# Patient Record
Sex: Female | Born: 1940 | Race: White | Hispanic: No | Marital: Single | Smoking: Never smoker
Health system: Southern US, Community
[De-identification: ages and names within clinical notes are randomized; demographics above are authoritative.]

## PROBLEM LIST (undated history)

## (undated) DIAGNOSIS — M1990 Unspecified osteoarthritis, unspecified site: Secondary | ICD-10-CM

## (undated) DIAGNOSIS — C801 Malignant (primary) neoplasm, unspecified: Secondary | ICD-10-CM

## (undated) HISTORY — PX: THYROIDECTOMY: SHX17

---

## 2015-03-17 ENCOUNTER — Emergency Department
Admission: EM | Admit: 2015-03-17 | Discharge: 2015-03-17 | Disposition: A | Discharge status: Home / Self Care | Payer: Medicare Other | Attending: Emergency Medicine | Admitting: Emergency Medicine

## 2015-03-17 ENCOUNTER — Emergency Department: Payer: Medicare Other

## 2015-03-17 ENCOUNTER — Encounter: Payer: Self-pay | Admitting: Emergency Medicine

## 2015-03-17 DIAGNOSIS — S300XXA Contusion of lower back and pelvis, initial encounter: Secondary | ICD-10-CM | POA: Diagnosis not present

## 2015-03-17 DIAGNOSIS — Y9289 Other specified places as the place of occurrence of the external cause: Secondary | ICD-10-CM | POA: Insufficient documentation

## 2015-03-17 DIAGNOSIS — Z79899 Other long term (current) drug therapy: Secondary | ICD-10-CM | POA: Diagnosis not present

## 2015-03-17 DIAGNOSIS — Y998 Other external cause status: Secondary | ICD-10-CM | POA: Insufficient documentation

## 2015-03-17 DIAGNOSIS — S0093XA Contusion of unspecified part of head, initial encounter: Secondary | ICD-10-CM

## 2015-03-17 DIAGNOSIS — Y9389 Activity, other specified: Secondary | ICD-10-CM | POA: Insufficient documentation

## 2015-03-17 DIAGNOSIS — W01198A Fall on same level from slipping, tripping and stumbling with subsequent striking against other object, initial encounter: Secondary | ICD-10-CM | POA: Insufficient documentation

## 2015-03-17 DIAGNOSIS — S0083XA Contusion of other part of head, initial encounter: Secondary | ICD-10-CM | POA: Diagnosis not present

## 2015-03-17 DIAGNOSIS — E039 Hypothyroidism, unspecified: Secondary | ICD-10-CM | POA: Insufficient documentation

## 2015-03-17 DIAGNOSIS — S0990XA Unspecified injury of head, initial encounter: Secondary | ICD-10-CM | POA: Diagnosis present

## 2015-03-17 HISTORY — DX: Unspecified osteoarthritis, unspecified site: M19.90

## 2015-03-17 HISTORY — DX: Malignant (primary) neoplasm, unspecified: C80.1

## 2015-03-17 MED ORDER — TRAMADOL HCL 50 MG PO TABS: 50.0000 mg | ORAL_TABLET | Freq: Two times a day (BID) | ORAL | Status: AC | PRN

## 2015-03-17 MED ORDER — TRAMADOL HCL 50 MG PO TABS
50.0000 mg | ORAL_TABLET | Freq: Once | ORAL | Status: AC
  Administered 2015-03-17: 50 mg via ORAL
  Filled 2015-03-17: qty 1

## 2015-03-17 NOTE — ED Provider Notes (Signed)
[facility] Emergency Department Provider Note  ____________________________________________  Time seen: Approximately 5:08 PM  I have reviewed the triage vital signs and the nursing notes.   HISTORY  Chief Complaint Fall; Back Pain; and Headache    HPI Shirley Mejia is a 75 y.o. female patient complaining of headache and low back pain secondary to a fall. Patient states she tripped and fell backwards 3 days ago. Patient states she landed on her buttocks and then struck the back of her head on the ground. Patient states she had a hematoma back of her head which is almost resolved. . Patient states she is having increasing headache and vertigo but denies any vision disturbance from this fall. Patient states he has increased back pain especially going up stairs. He denies any radicular component to her back pain. Patient denies any bladder or bowel dysfunction. Patient rates her pain discomfort as a 6/10. No palliative measures taken for this complaint.  Past Medical History  Diagnosis Date  . Arthritis     osteoperosis  . 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   Past Surgical History  Procedure Laterality Date  . Thyroidectomy      Current Outpatient Rx  Name  Route  Sig  Dispense  Refill  . levothyroxine (SYNTHROID, LEVOTHROID) 100 MCG tablet   Oral   Take 100 mcg by mouth daily before breakfast.         . traMADol (ULTRAM) 50 MG tablet   Oral   Take 1 tablet (50 mg total) by mouth every 12 (twelve) hours as needed for moderate pain.   12 tablet   0     Allergies Review of patient's allergies indicates no known allergies.  No family history on file.  Social History Social History  Substance Use Topics  . Smoking status: Never Smoker   . Smokeless tobacco: Never Used  . Alcohol Use: No    Review of Systems Constitutional: No fever/chills Eyes: No visual changes. ENT: No sore  throat. Cardiovascular: Denies chest pain. Respiratory: Denies shortness of breath. Gastrointestinal: No abdominal pain.  No nausea, no vomiting.  No diarrhea.  No constipation. Genitourinary: Negative for dysuria. Musculoskeletal: Positive for back pain. Skin: Negative for rash. Neurological: Positive for headaches, but denies focal weakness or numbness. Endocrine:Hypothyroidism   ____________________________________________   PHYSICAL EXAM:  VITAL SIGNS: ED Triage Vitals  Enc Vitals Group     BP 03/17/15 1645 189/78 mmHg     Pulse Rate 03/17/15 1645 63     Resp 03/17/15 1645 18     Temp 03/17/15 1645 98 F (36.7 C)     Temp Source 03/17/15 1645 Oral     SpO2 03/17/15 1645 98 %     Weight 03/17/15 1645 119 lb (53.978 kg)     Height 03/17/15 1645 5\' 3"  (1.6 m)     Head Cir --      Peak Flow --      Pain Score 03/17/15 1646 6     Pain Loc --      Pain Edu? --      Excl. in [place]? --     Constitutional: Alert and oriented. Well appearing and in no acute distress. Eyes: Conjunctivae are normal. PERRL. EOMI. Head: Atraumatic. No obvious swelling Nose: No congestion/rhinnorhea. Mouth/Throat: Mucous membranes are moist.  Oropharynx non-erythematous. Neck: No stridor.  No cervical spine tenderness to palpation. Hematological/Lymphatic/Immunilogical:  No cervical lymphadenopathy. Cardiovascular: Normal rate, regular rhythm. Grossly normal heart sounds.  Good peripheral circulation. Respiratory: Normal respiratory effort.  No retractions. Lungs CTAB. Gastrointestinal: Soft and nontender. No distention. No abdominal bruits. No CVA tenderness. Musculoskeletal: No obvious deformity. Patient has decreased range of motion with flexion. Patient has moderate guarding palpation L4-S1. Neurologic:  Normal speech and language. No gross focal neurologic deficits are appreciated. No gait instability. Skin:  Skin is warm, dry and intact. No rash noted. Ecchymosis coccyx area Psychiatric: Mood  and affect are normal. Speech and behavior are normal.  ____________________________________________   LABS (all labs ordered are listed, but only abnormal results are displayed)  Labs Reviewed - No data to display ____________________________________________  EKG   ____________________________________________  RADIOLOGY  No acute findings on CT of head. X-ray of back reveals only degenerative changes. I, Sable Feil, personally viewed and evaluated these images (plain radiographs) as part of my medical decision making, as well as reviewing the written report by the radiologist.  ____________________________________________   PROCEDURES  Procedure(s) performed: None  Critical Care performed: No  ____________________________________________   INITIAL IMPRESSION / ASSESSMENT AND PLAN / ED COURSE  Pertinent labs & imaging results that were available during my care of the patient were reviewed by me and considered in my medical decision making (see chart for details).  Head and back contusion secondary to fall. Discuss CT and x-ray findings with patient. Patient given prescription for tramadol. Patient advised to follow-up family doctor if condition persists. ____________________________________________   FINAL CLINICAL IMPRESSION(S) / ED DIAGNOSES  Final diagnoses:  Head contusion, initial encounter  Coccyx contusion, initial encounter      Sable Feil, PA-C 03/17/15 1815  Nance Pear, MD 03/17/15 [phone]

## 2015-03-17 NOTE — ED Notes (Signed)
AAOx3.  Skin warm and dry.  Ambulates with easy and steady gait.  Moving all extremities equally and strong. Posture upright and relaxed.

## 2015-03-17 NOTE — ED Notes (Signed)
States tripped on Saturday, fell - landing on back.  C/O low back pain and headaches since fall.

## 2016-12-05 IMAGING — CR DG LUMBAR SPINE COMPLETE 4+V
1 series · 5 of 5 positions shown · non-contrast
Comparison: None.

CLINICAL DATA: Patient status post fall. Increasing lower back
pain.

EXAM:
LUMBAR SPINE - COMPLETE 4+ VIEW

[Series 1: t lumbar spine ap · 0.14mm/px · 5 of 5 slices shown]
[im 1/5]
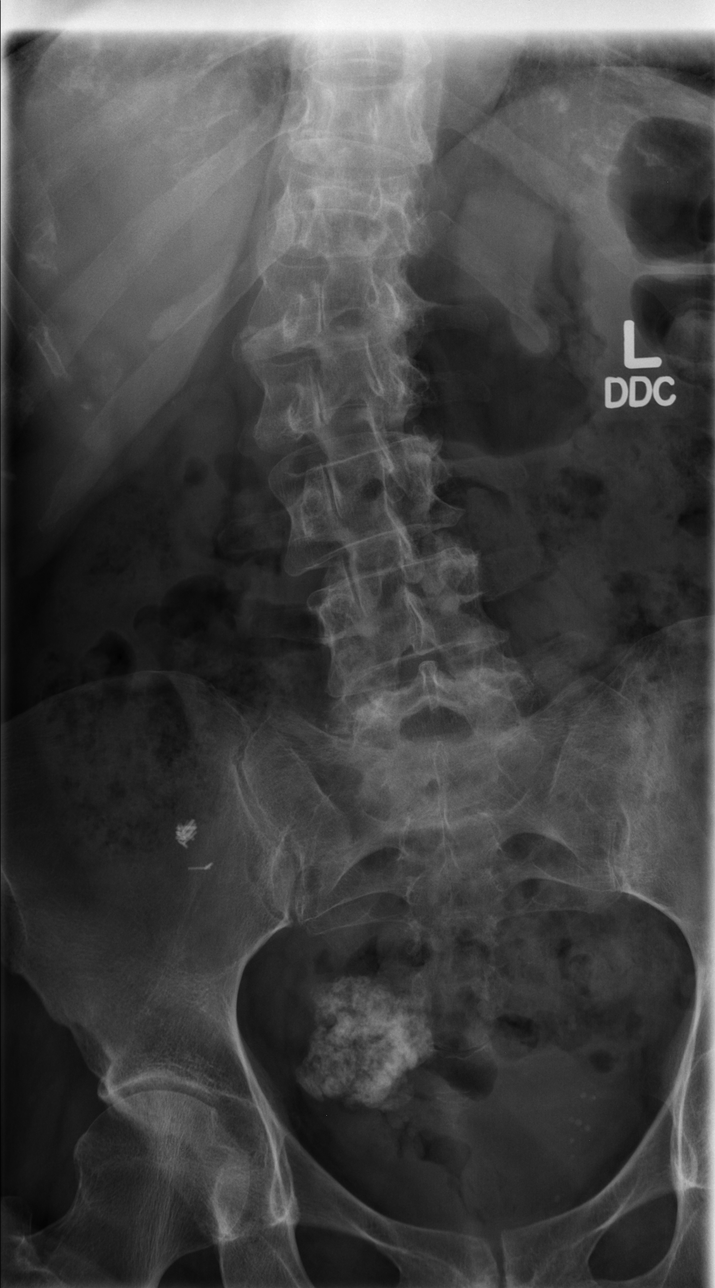
[im 2/5]
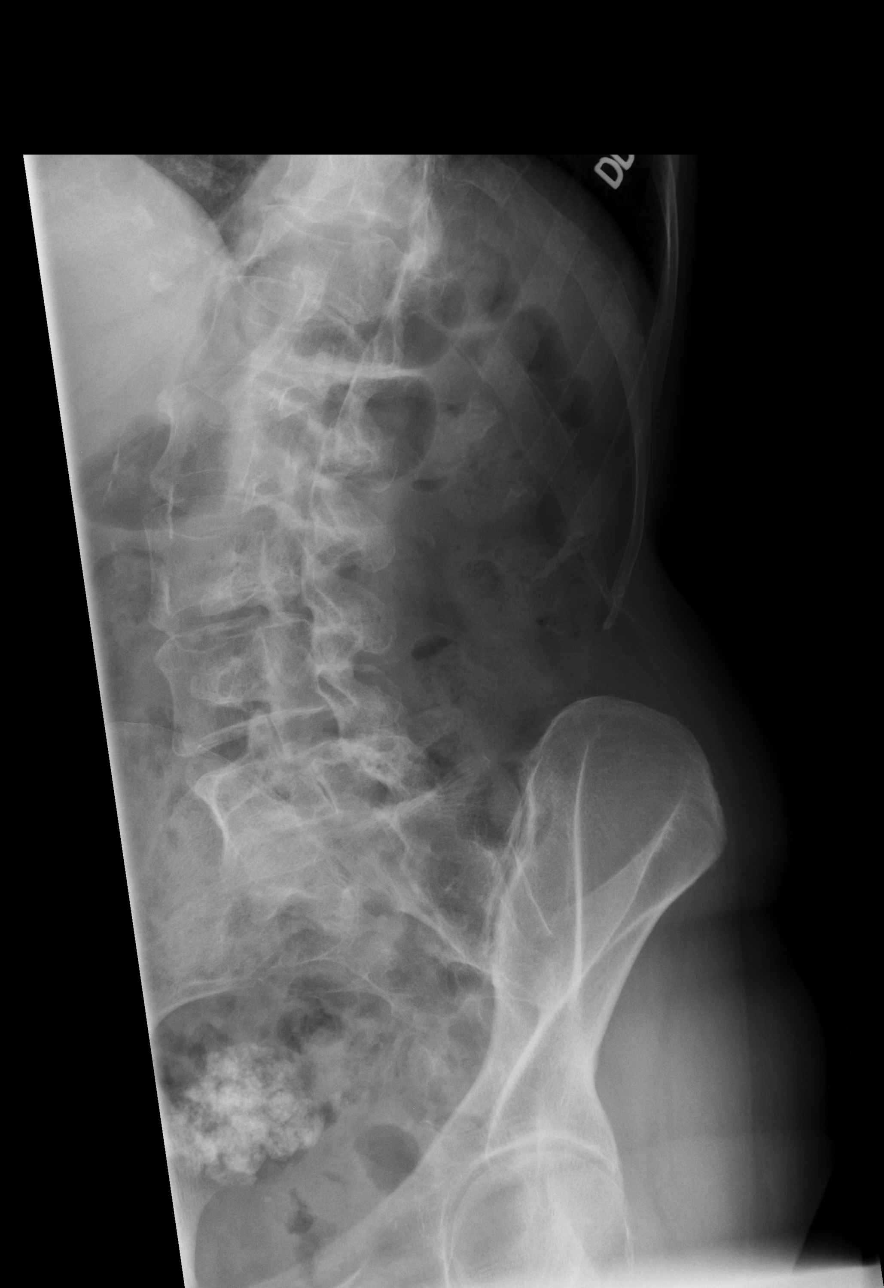
[im 3/5]
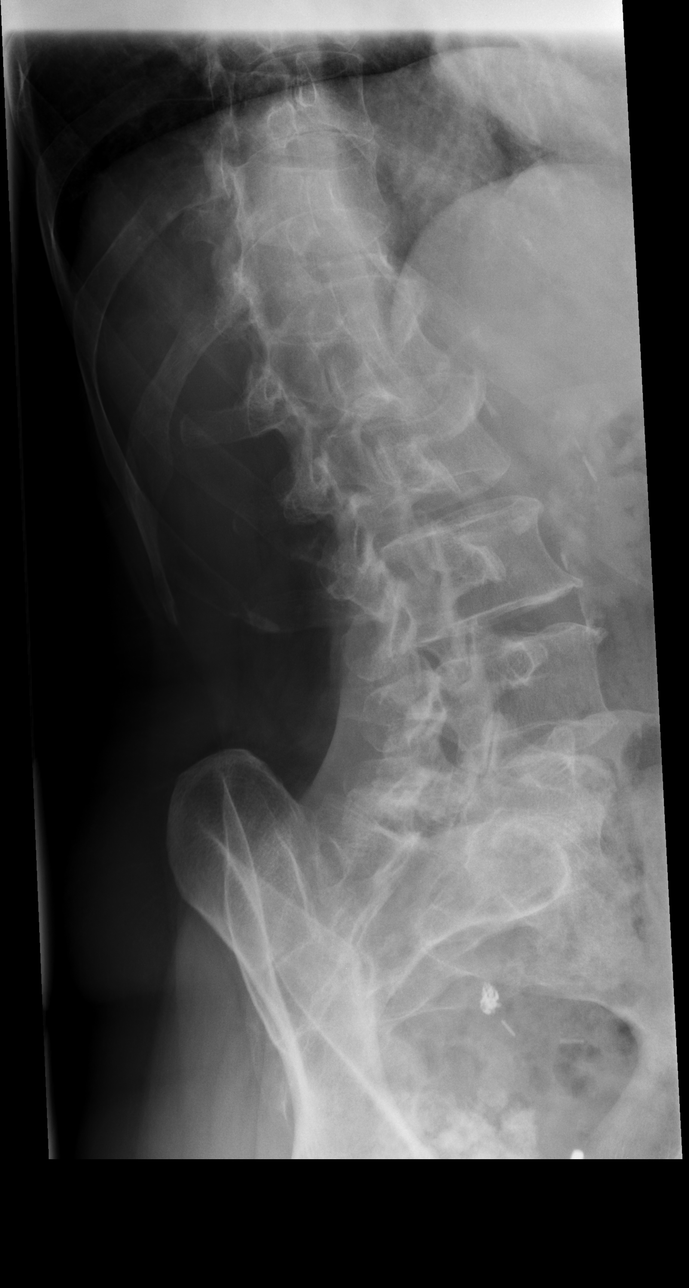
[im 4/5]
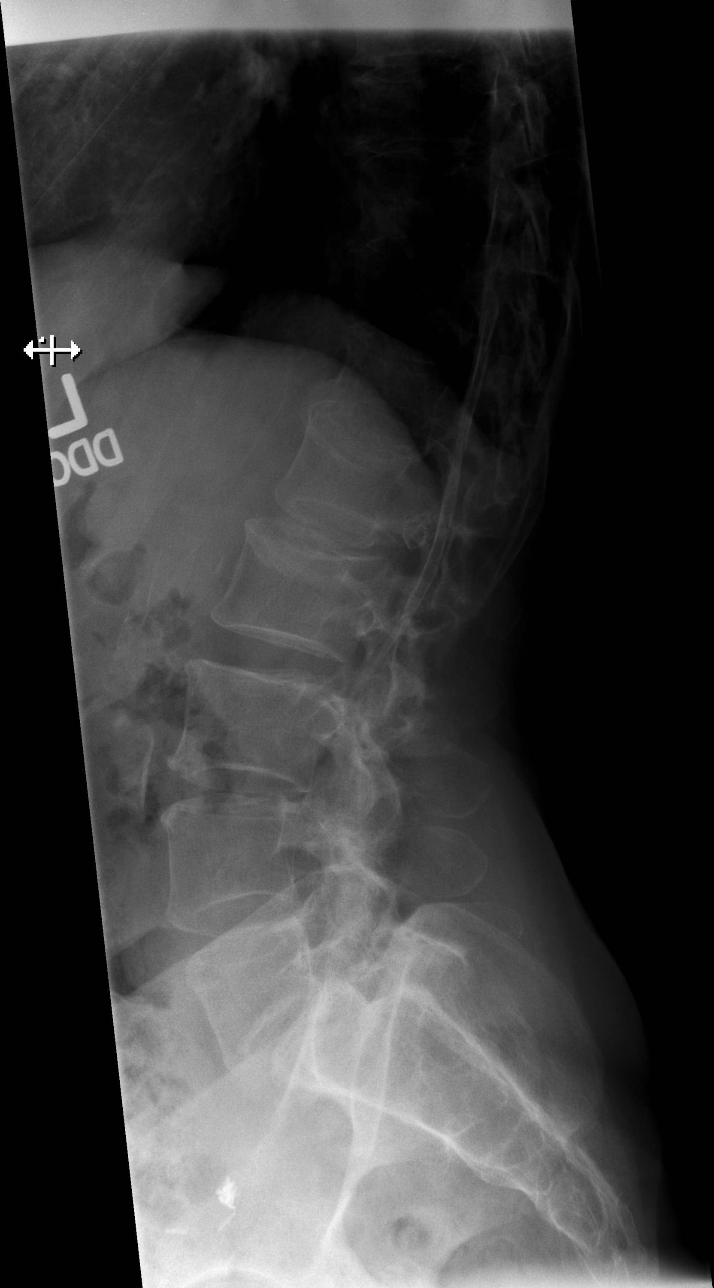
[im 5/5]
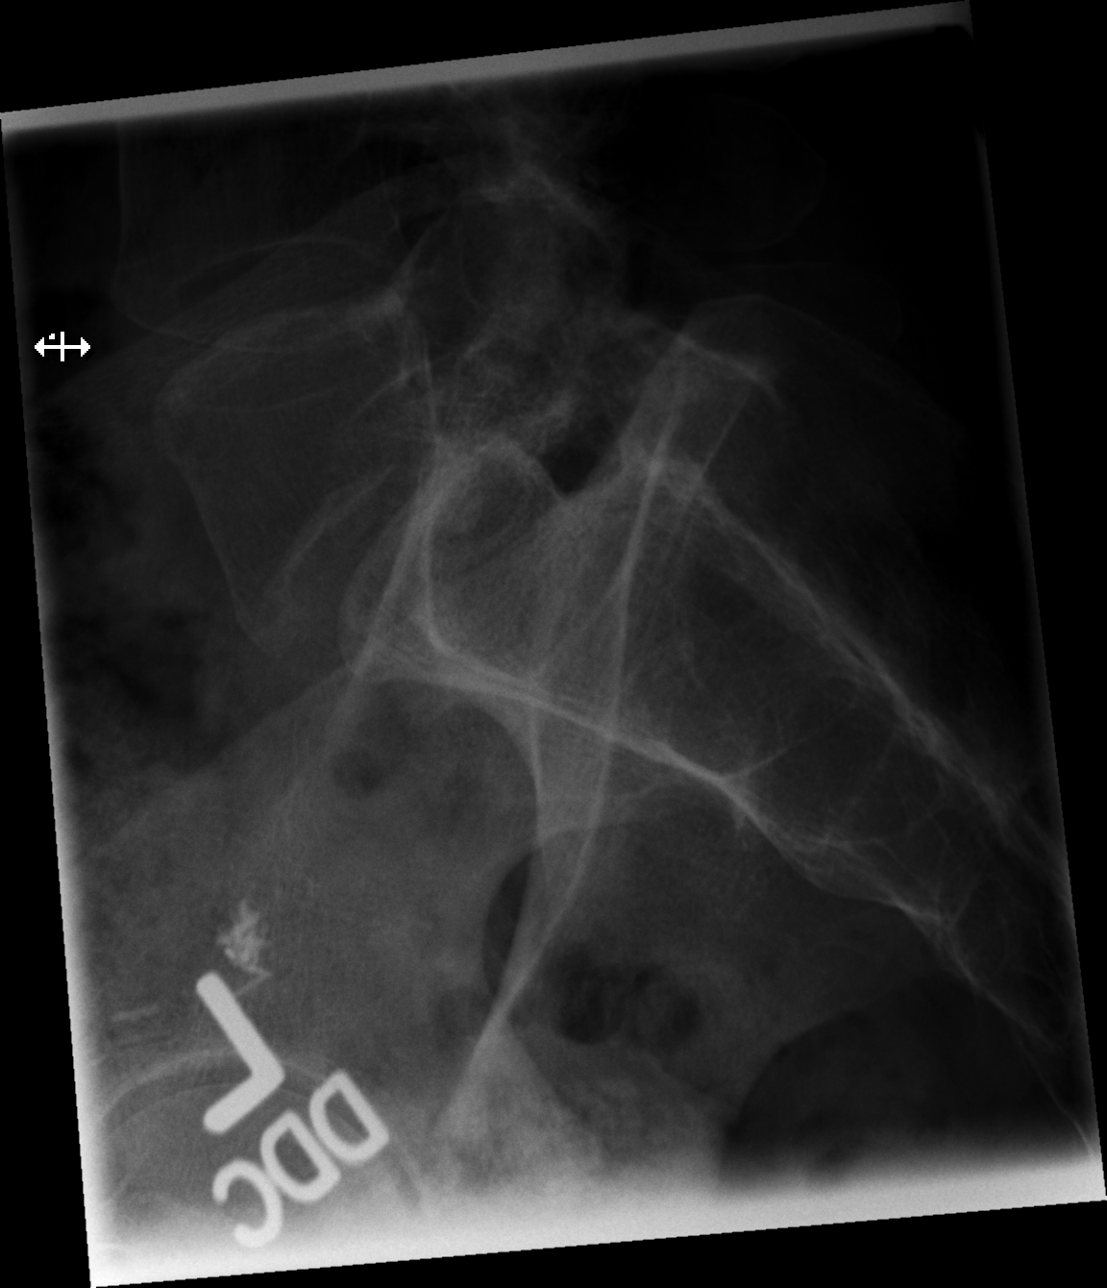

[5 of 5 positions shown; findings below may reference images not displayed]

FINDINGS: There is grade 1 anterolisthesis of L5 on S1. Rightward curvature of
lumbar spine. Relative preservation of the vertebral body heights.
Left SI joint degenerative change. Large calcified mass in the
pelvis. Probable surgical clips within the right hemipelvis. Stool
throughout the visualized colon.
IMPRESSION: Grade 1 anterolisthesis of L5 on S1

Calcified mass within the pelvis is nonspecific however likely
represents a fibroid. Consider dedicated evaluation with pelvic
ultrasound.

## 2016-12-05 IMAGING — CT CT HEAD W/O CM
2 series · 14 of 30 positions shown, 16 images · non-contrast
Comparison: None.

CLINICAL DATA: Patient status post fall. Pain to the back of the
head. Initial encounter. No reported loss of consciousness.

EXAM:
CT HEAD WITHOUT CONTRAST
TECHNIQUE: Contiguous axial images were obtained from the base of the skull
through the vertex without intravenous contrast.

[Series 2: head wo · axial · 0.42mm/px · z∈[+546,+661]mm · 6 of 36 slices shown, 8 images]
[im 6/36  brain]
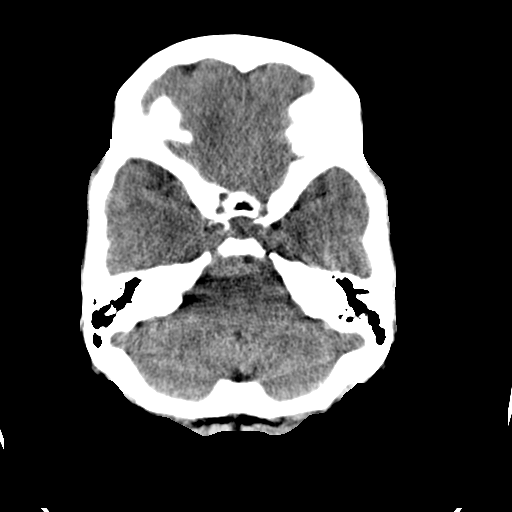
[im 6/36  bone]
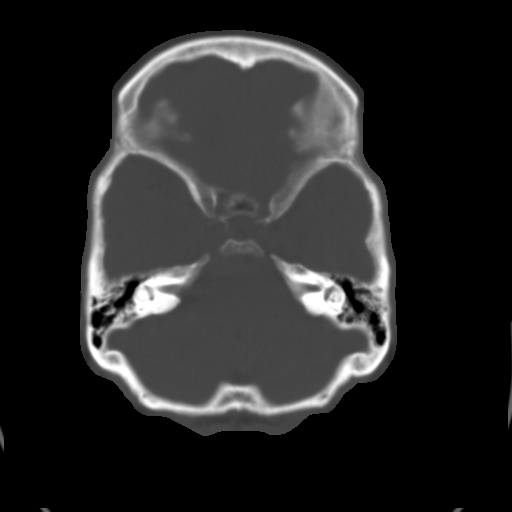
[im 11/36  brain]
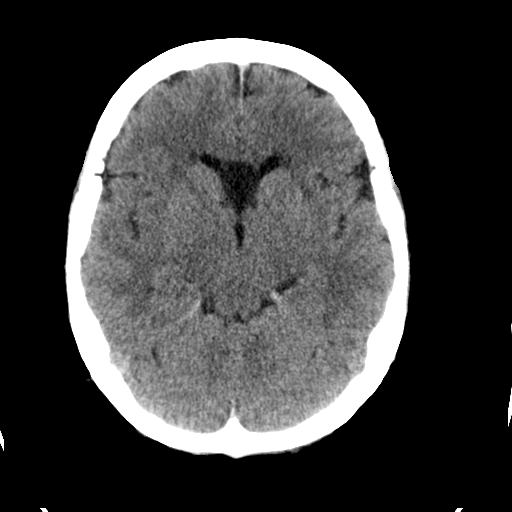
[im 16/36  brain]
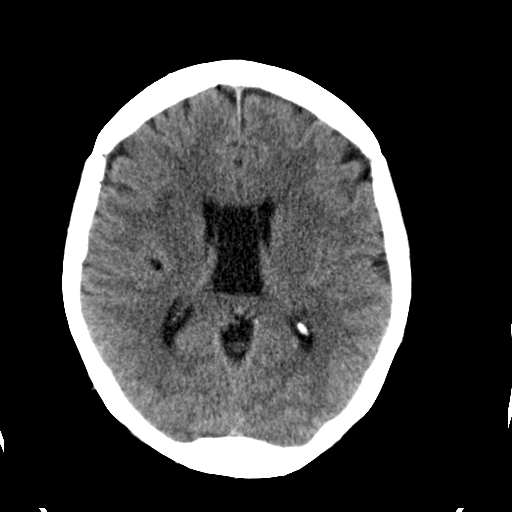
[im 21/36  brain]
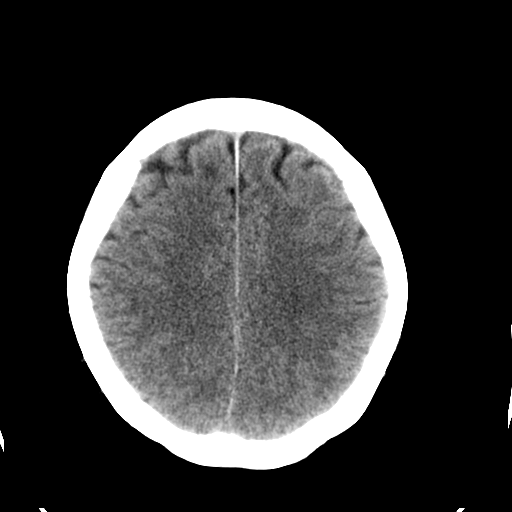
[im 26/36  brain]
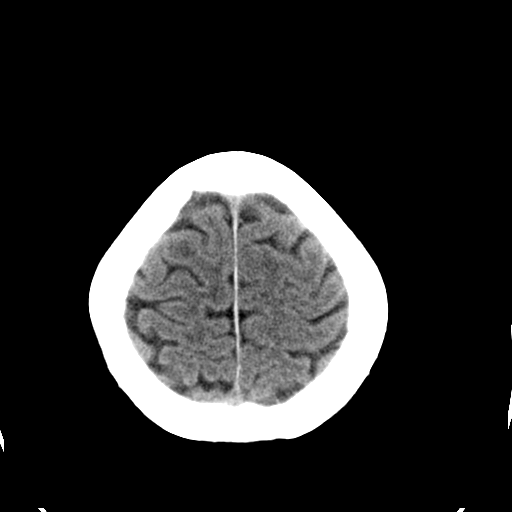
[im 26/36  bone]
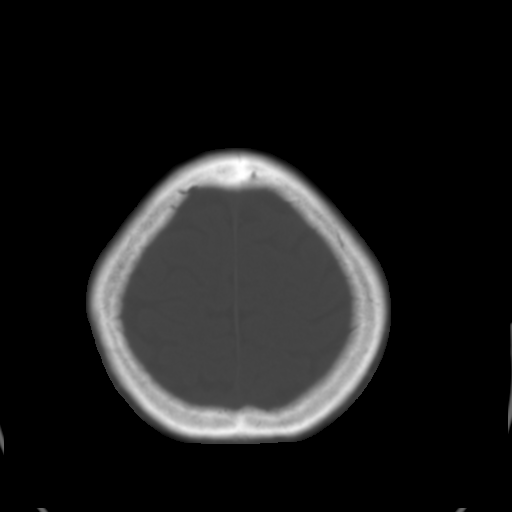
[im 31/36  brain]
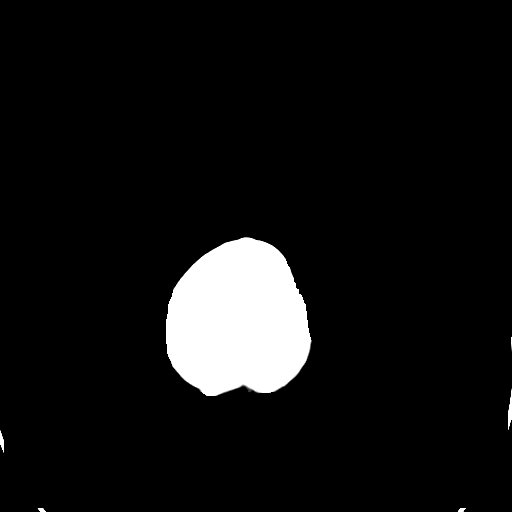

[Series 3: head bone · axial · 0.42mm/px · z∈[+537,+669]mm · 8 of 108 slices shown]
[im 11/108  bone]
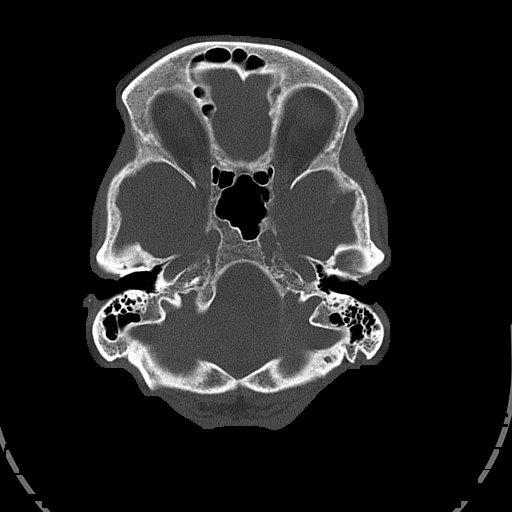
[im 21/108  bone]
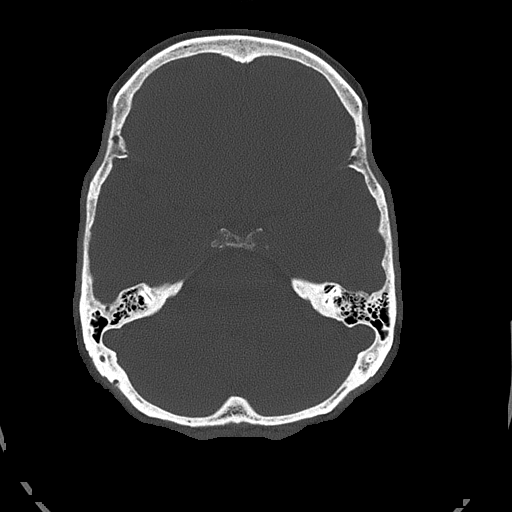
[im 36/108  bone]
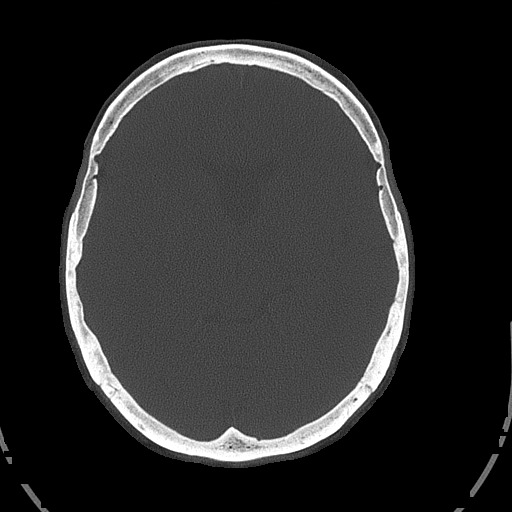
[im 46/108  bone]
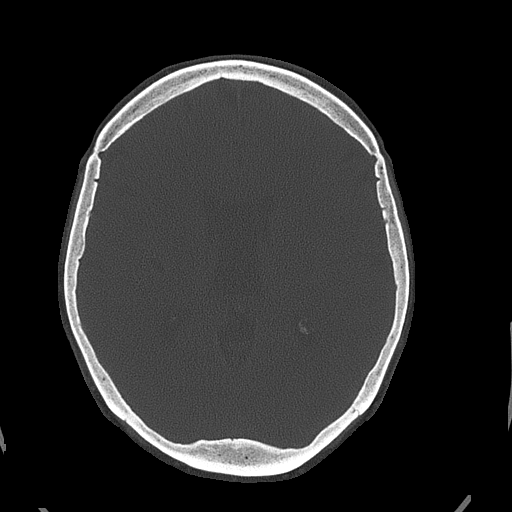
[im 62/108  bone]
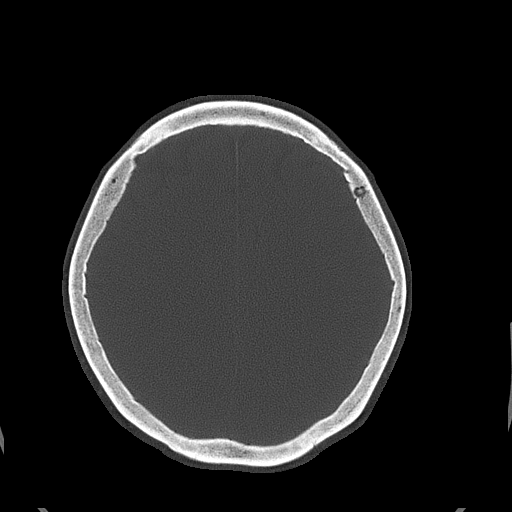
[im 72/108  bone]
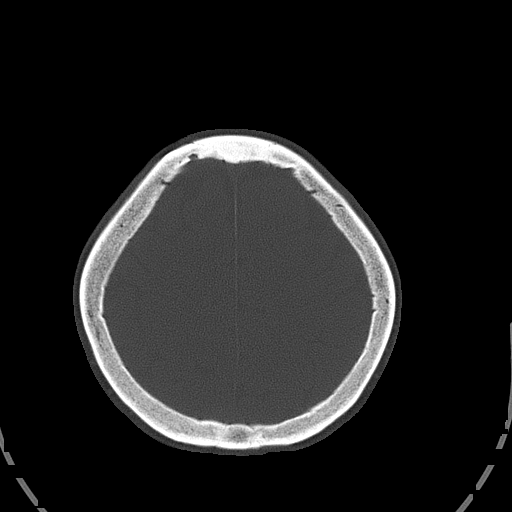
[im 87/108  bone]
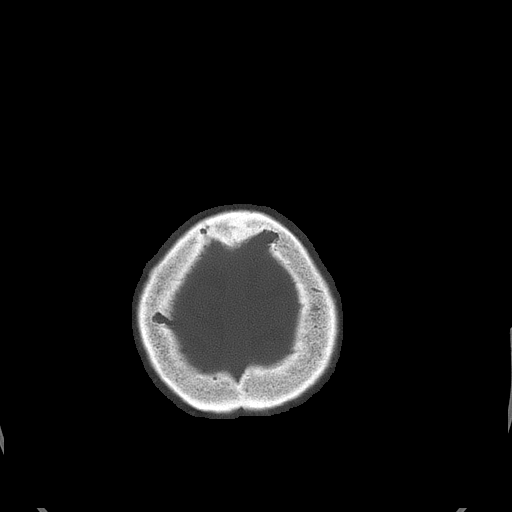
[im 97/108  bone]
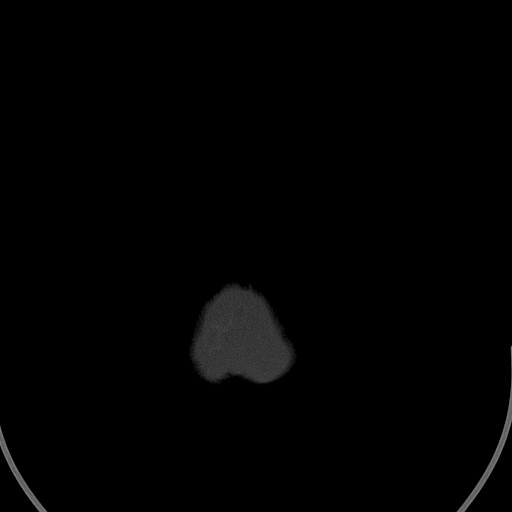

[14 of 30 positions shown; findings below may reference images not displayed]

FINDINGS: Ventricles and sulci are appropriate for patient's age. No evidence
for acute cortically based infarct, intracranial hemorrhage, mass
lesion or mass-effect. Cavum septum pellucidum. Orbits are
unremarkable. Paranasal sinuses are well aerated. Mastoid air cells
are unremarkable. The calvarium is intact.
IMPRESSION: No acute intracranial process.
